# Patient Record
Sex: Female | Born: 1970 | Race: White | Hispanic: No | Marital: Single
Health system: Southern US, Community
[De-identification: ages and names within clinical notes are randomized; demographics above are authoritative.]

## PROBLEM LIST (undated history)

## (undated) DIAGNOSIS — E119 Type 2 diabetes mellitus without complications: Secondary | ICD-10-CM

---

## 2014-01-16 DIAGNOSIS — E782 Mixed hyperlipidemia: Secondary | ICD-10-CM | POA: Insufficient documentation

## 2014-01-16 DIAGNOSIS — F419 Anxiety disorder, unspecified: Secondary | ICD-10-CM | POA: Insufficient documentation

## 2014-01-16 DIAGNOSIS — E1142 Type 2 diabetes mellitus with diabetic polyneuropathy: Secondary | ICD-10-CM | POA: Insufficient documentation

## 2014-01-16 DIAGNOSIS — I10 Essential (primary) hypertension: Secondary | ICD-10-CM | POA: Insufficient documentation

## 2014-06-15 ENCOUNTER — Ambulatory Visit: Payer: Self-pay | Admitting: Family Medicine

## 2015-05-03 ENCOUNTER — Other Ambulatory Visit: Payer: Self-pay | Admitting: Family Medicine

## 2015-05-03 DIAGNOSIS — Z23 Encounter for immunization: Secondary | ICD-10-CM

## 2015-06-20 ENCOUNTER — Ambulatory Visit
Admission: RE | Admit: 2015-06-20 | Discharge: 2015-06-20 | Disposition: A | Discharge status: Home / Self Care | Payer: BC Managed Care – PPO | Source: Ambulatory Visit | Attending: Family Medicine | Admitting: Family Medicine

## 2015-06-20 DIAGNOSIS — Z1231 Encounter for screening mammogram for malignant neoplasm of breast: Secondary | ICD-10-CM | POA: Diagnosis present

## 2015-06-20 DIAGNOSIS — Z23 Encounter for immunization: Secondary | ICD-10-CM

## 2016-05-30 ENCOUNTER — Other Ambulatory Visit: Payer: Self-pay | Admitting: Family Medicine

## 2016-05-30 DIAGNOSIS — Z1231 Encounter for screening mammogram for malignant neoplasm of breast: Secondary | ICD-10-CM

## 2016-06-22 ENCOUNTER — Ambulatory Visit
Admission: RE | Admit: 2016-06-22 | Discharge: 2016-06-22 | Disposition: A | Discharge status: Home / Self Care | Payer: BC Managed Care – PPO | Source: Ambulatory Visit | Attending: Family Medicine | Admitting: Family Medicine

## 2016-06-22 DIAGNOSIS — Z1231 Encounter for screening mammogram for malignant neoplasm of breast: Secondary | ICD-10-CM | POA: Insufficient documentation

## 2016-12-07 DIAGNOSIS — M310 Hypersensitivity angiitis: Secondary | ICD-10-CM | POA: Insufficient documentation

## 2017-02-26 DIAGNOSIS — D251 Intramural leiomyoma of uterus: Secondary | ICD-10-CM | POA: Insufficient documentation

## 2017-07-04 ENCOUNTER — Other Ambulatory Visit: Payer: Self-pay | Admitting: Family Medicine

## 2017-07-04 DIAGNOSIS — Z1231 Encounter for screening mammogram for malignant neoplasm of breast: Secondary | ICD-10-CM

## 2017-07-19 ENCOUNTER — Ambulatory Visit
Admission: RE | Admit: 2017-07-19 | Discharge: 2017-07-19 | Disposition: A | Discharge status: Home / Self Care | Payer: BC Managed Care – PPO | Source: Ambulatory Visit | Attending: Family Medicine | Admitting: Family Medicine

## 2017-07-19 DIAGNOSIS — Z1231 Encounter for screening mammogram for malignant neoplasm of breast: Secondary | ICD-10-CM | POA: Insufficient documentation

## 2019-06-19 DIAGNOSIS — J302 Other seasonal allergic rhinitis: Secondary | ICD-10-CM | POA: Insufficient documentation

## 2019-06-19 DIAGNOSIS — E559 Vitamin D deficiency, unspecified: Secondary | ICD-10-CM | POA: Insufficient documentation

## 2019-06-19 DIAGNOSIS — D649 Anemia, unspecified: Secondary | ICD-10-CM | POA: Insufficient documentation

## 2019-06-19 DIAGNOSIS — D500 Iron deficiency anemia secondary to blood loss (chronic): Secondary | ICD-10-CM | POA: Insufficient documentation

## 2019-06-19 DIAGNOSIS — E039 Hypothyroidism, unspecified: Secondary | ICD-10-CM | POA: Insufficient documentation

## 2019-09-09 ENCOUNTER — Other Ambulatory Visit: Payer: Self-pay | Admitting: Family Medicine

## 2019-09-09 DIAGNOSIS — Z1231 Encounter for screening mammogram for malignant neoplasm of breast: Secondary | ICD-10-CM

## 2019-11-13 ENCOUNTER — Ambulatory Visit
Admission: RE | Admit: 2019-11-13 | Discharge: 2019-11-13 | Disposition: A | Discharge status: Home / Self Care | Payer: BC Managed Care – PPO | Source: Ambulatory Visit | Attending: Family Medicine | Admitting: Family Medicine

## 2019-11-13 DIAGNOSIS — Z1231 Encounter for screening mammogram for malignant neoplasm of breast: Secondary | ICD-10-CM | POA: Diagnosis present

## 2021-10-16 NOTE — Progress Notes (Signed)
Patient pre-screened for BCCCP eligibility due to COVID 19 precautions. Two patient identifiers used for verification that I was speaking to correct patient.  Patient to Present directly to [facility] 10/18/21 for BCCCP screening mammogram.  

## 2021-10-18 ENCOUNTER — Ambulatory Visit
Admission: RE | Admit: 2021-10-18 | Discharge: 2021-10-18 | Disposition: A | Discharge status: Home / Self Care | Payer: Self-pay | Source: Ambulatory Visit | Attending: Oncology | Admitting: Oncology

## 2021-10-18 ENCOUNTER — Other Ambulatory Visit: Payer: Self-pay

## 2021-10-18 ENCOUNTER — Ambulatory Visit: Payer: Self-pay | Attending: Oncology

## 2021-10-18 DIAGNOSIS — Z0000 Encounter for general adult medical examination without abnormal findings: Secondary | ICD-10-CM | POA: Insufficient documentation

## 2021-11-05 NOTE — Progress Notes (Signed)
Letter mailed from [facility] to notify of normal mammogram results.  Patient to return in one year for annual screening.  Copy to [facility]. 

## 2022-03-18 IMAGING — MG MM DIGITAL SCREENING BILAT W/ TOMO AND CAD
8 series · 9 of 24 positions shown · non-contrast
Comparison: Previous exam(s).

CLINICAL DATA: Screening.

EXAM:
DIGITAL SCREENING BILATERAL MAMMOGRAM WITH TOMOSYNTHESIS AND CAD
TECHNIQUE: Bilateral screening digital craniocaudal and mediolateral oblique
mammograms were obtained. Bilateral screening digital breast
tomosynthesis was performed. The images were evaluated with
computer-aided detection.

[R MLO synth-2D]
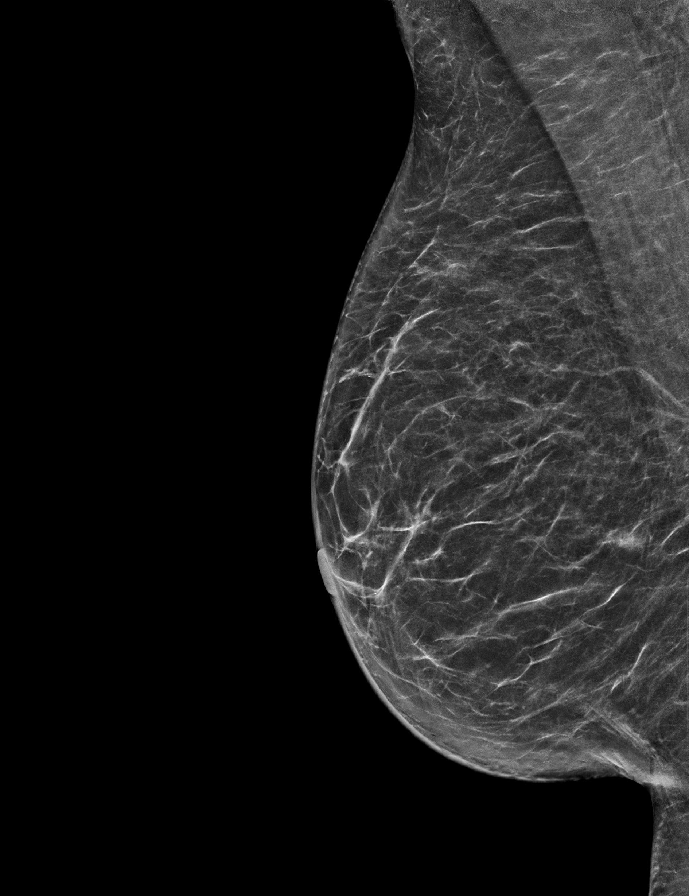

[L CC synth-2D]
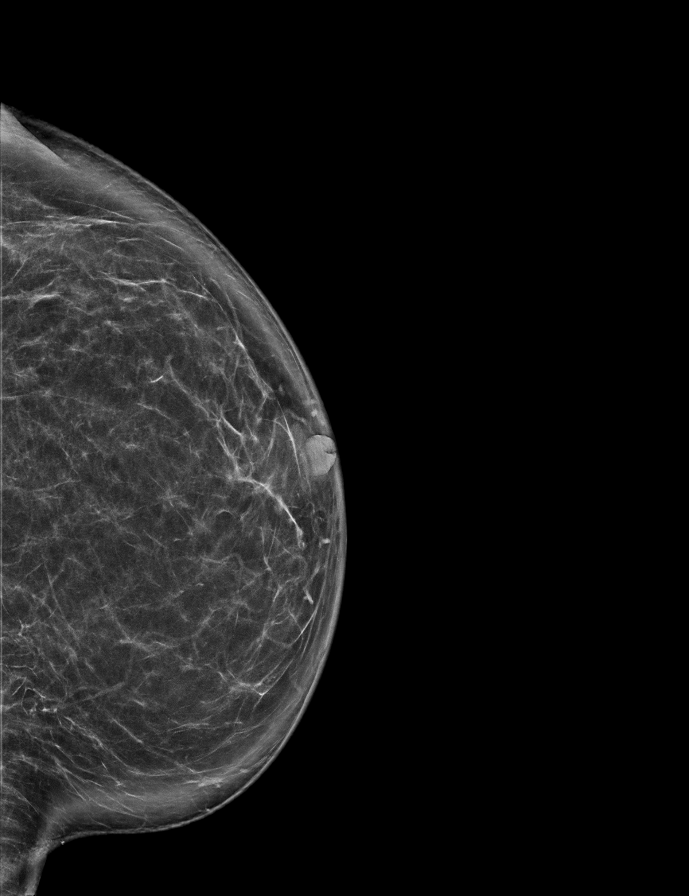

[R CC synth-2D]
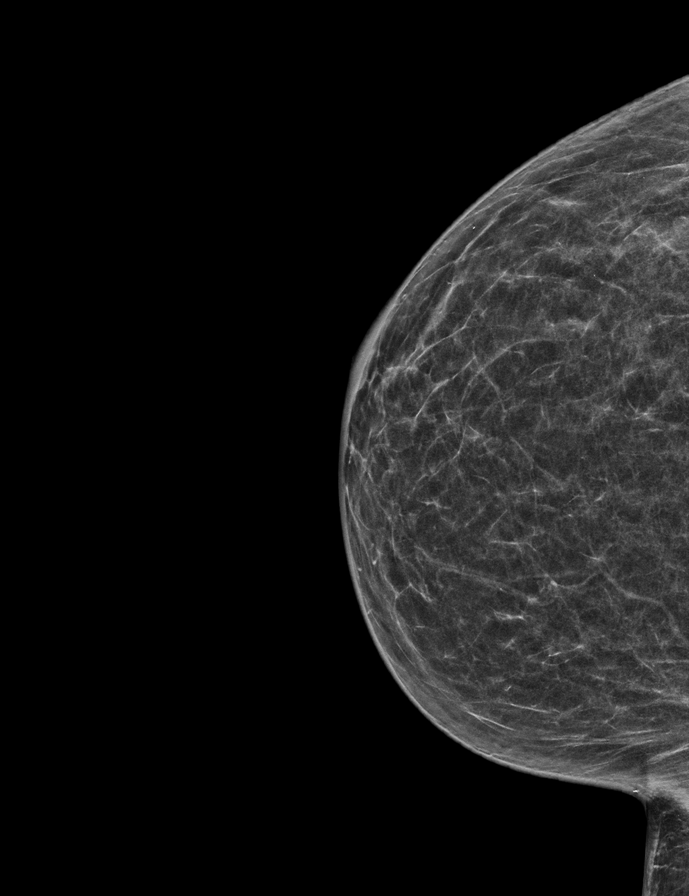

[L MLO synth-2D]
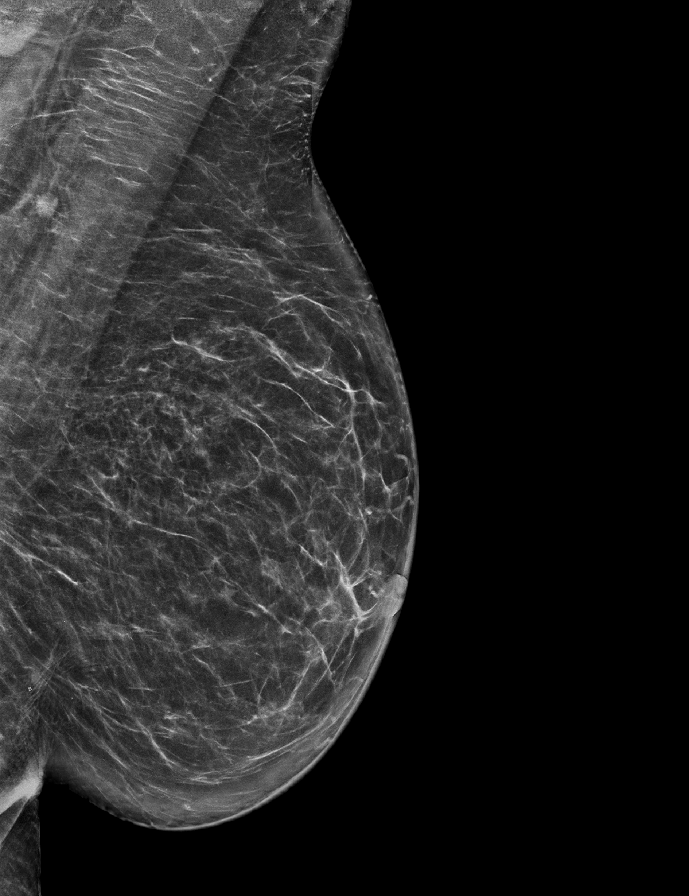

[R MLO tomo · 2 of 67 frames shown]
[frame 22/67]
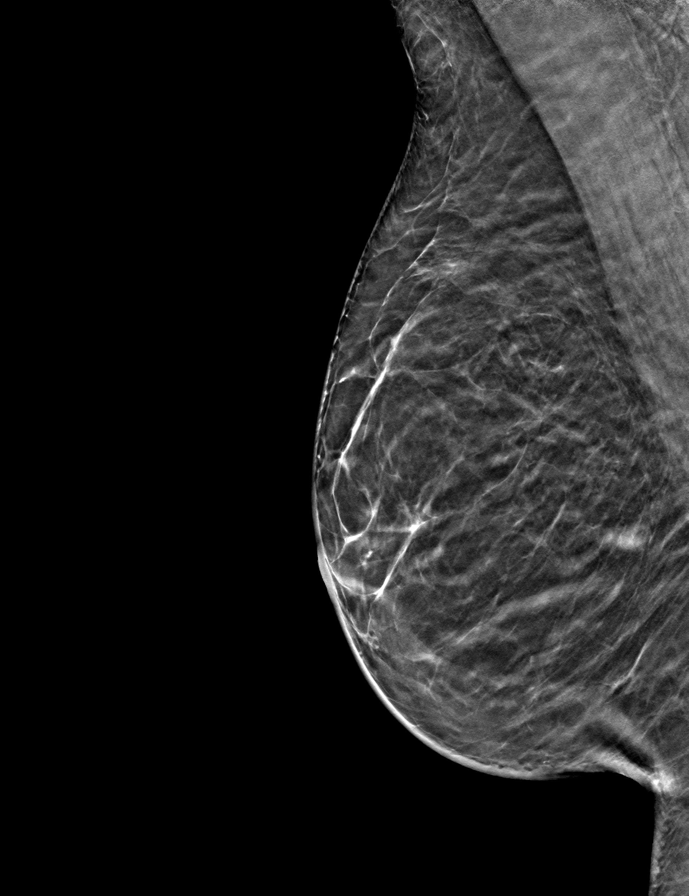
[frame 34/67]
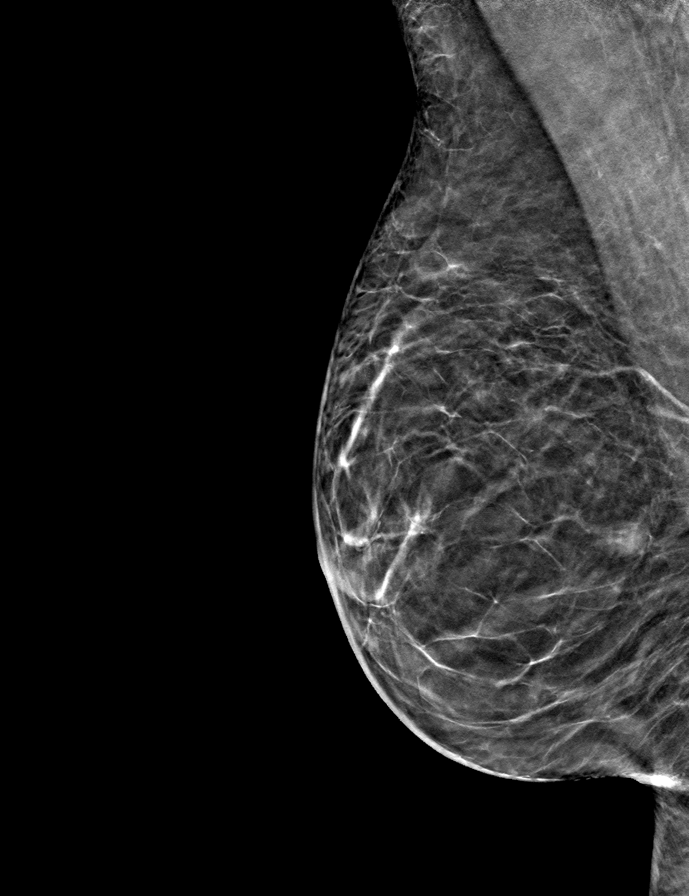

[R CC tomo · tomo slice 25/48.0]
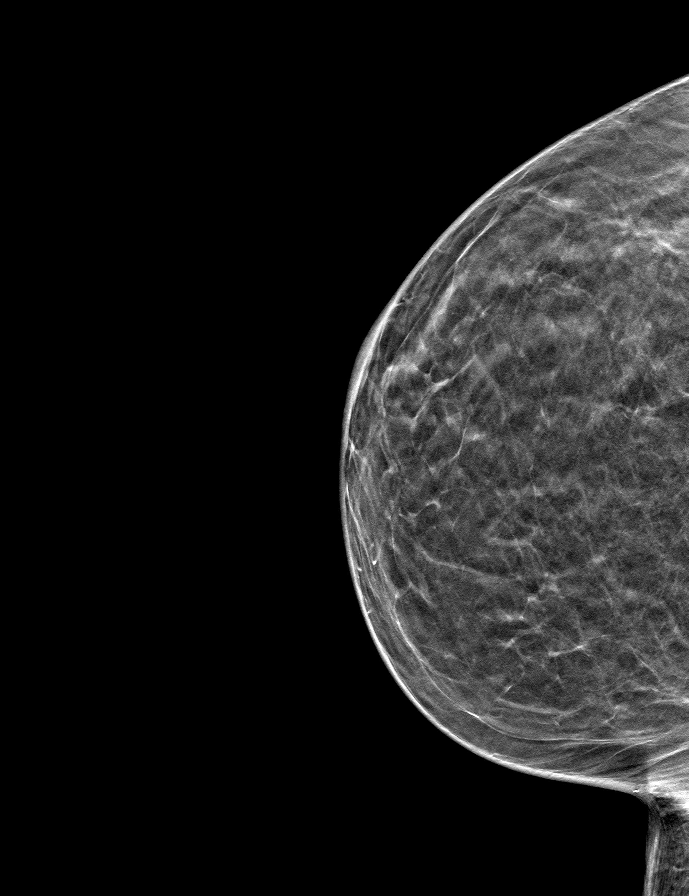

[L MLO tomo · tomo slice 37/74.0]
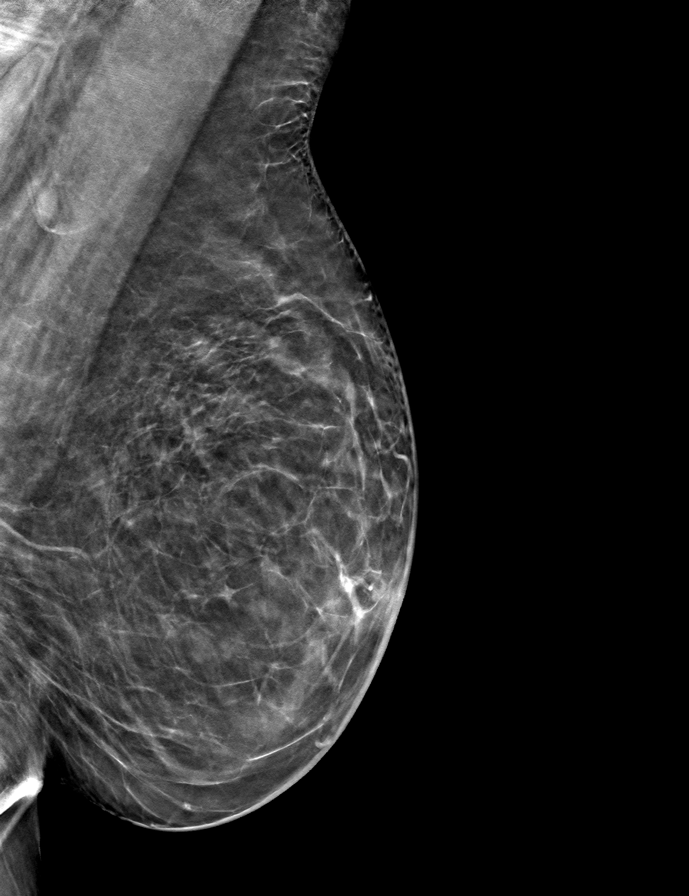

[L CC tomo · tomo slice 39/78.0]
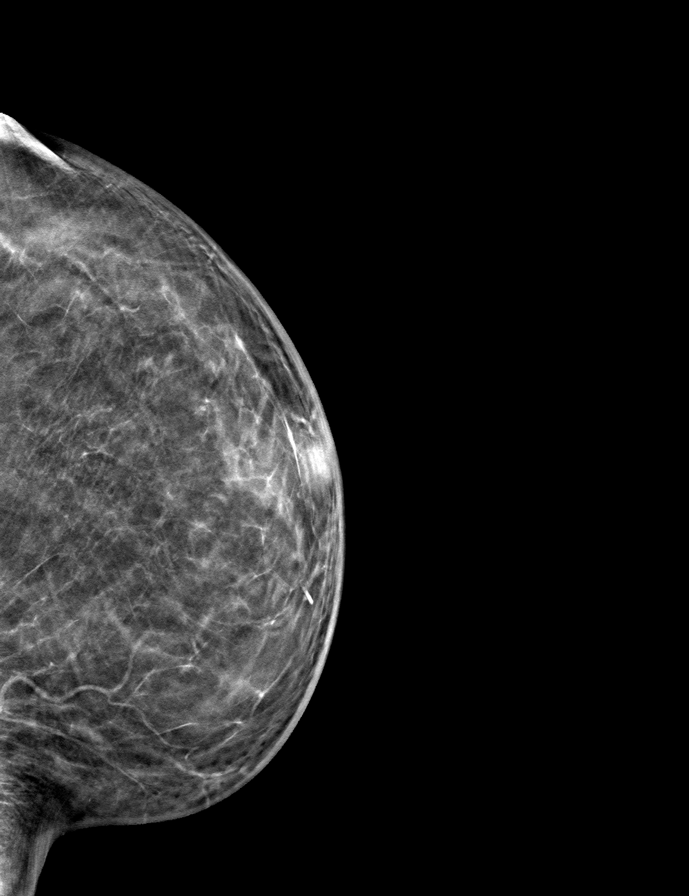

[9 of 24 positions shown; findings below may reference images not displayed]

ACR Breast Density Category b: There are scattered areas of
fibroglandular density.
FINDINGS: There are no findings suspicious for malignancy.
IMPRESSION: No mammographic evidence of malignancy. A result letter of this
screening mammogram will be mailed directly to the patient.

RECOMMENDATION:
Screening mammogram in one year. (Code:51-O-LD2)

BI-RADS CATEGORY  1: Negative.

## 2022-05-07 DIAGNOSIS — H25812 Combined forms of age-related cataract, left eye: Secondary | ICD-10-CM | POA: Insufficient documentation

## 2022-06-20 DIAGNOSIS — H2513 Age-related nuclear cataract, bilateral: Secondary | ICD-10-CM | POA: Insufficient documentation

## 2022-12-20 DIAGNOSIS — E113593 Type 2 diabetes mellitus with proliferative diabetic retinopathy without macular edema, bilateral: Secondary | ICD-10-CM | POA: Diagnosis not present

## 2023-01-18 DIAGNOSIS — Z712 Person consulting for explanation of examination or test findings: Secondary | ICD-10-CM | POA: Diagnosis not present

## 2023-01-18 DIAGNOSIS — F321 Major depressive disorder, single episode, moderate: Secondary | ICD-10-CM | POA: Diagnosis not present

## 2023-01-18 DIAGNOSIS — I10 Essential (primary) hypertension: Secondary | ICD-10-CM | POA: Diagnosis not present

## 2023-01-18 DIAGNOSIS — Z0130 Encounter for examination of blood pressure without abnormal findings: Secondary | ICD-10-CM | POA: Diagnosis not present

## 2023-01-18 DIAGNOSIS — Z1389 Encounter for screening for other disorder: Secondary | ICD-10-CM | POA: Diagnosis not present

## 2023-01-18 DIAGNOSIS — E119 Type 2 diabetes mellitus without complications: Secondary | ICD-10-CM | POA: Diagnosis not present

## 2023-01-18 DIAGNOSIS — E781 Pure hyperglyceridemia: Secondary | ICD-10-CM | POA: Diagnosis not present

## 2023-01-29 ENCOUNTER — Other Ambulatory Visit: Payer: Self-pay | Admitting: Family Medicine

## 2023-01-29 DIAGNOSIS — Z1231 Encounter for screening mammogram for malignant neoplasm of breast: Secondary | ICD-10-CM

## 2023-03-07 DIAGNOSIS — E113593 Type 2 diabetes mellitus with proliferative diabetic retinopathy without macular edema, bilateral: Secondary | ICD-10-CM | POA: Diagnosis not present

## 2023-04-08 DIAGNOSIS — Z0130 Encounter for examination of blood pressure without abnormal findings: Secondary | ICD-10-CM | POA: Diagnosis not present

## 2023-04-08 DIAGNOSIS — E119 Type 2 diabetes mellitus without complications: Secondary | ICD-10-CM | POA: Diagnosis not present

## 2023-04-08 DIAGNOSIS — Z712 Person consulting for explanation of examination or test findings: Secondary | ICD-10-CM | POA: Diagnosis not present

## 2023-04-08 DIAGNOSIS — Z1389 Encounter for screening for other disorder: Secondary | ICD-10-CM | POA: Diagnosis not present

## 2023-04-08 DIAGNOSIS — E781 Pure hyperglyceridemia: Secondary | ICD-10-CM | POA: Diagnosis not present

## 2023-04-08 DIAGNOSIS — I10 Essential (primary) hypertension: Secondary | ICD-10-CM | POA: Diagnosis not present

## 2023-04-08 DIAGNOSIS — F411 Generalized anxiety disorder: Secondary | ICD-10-CM | POA: Diagnosis not present

## 2023-05-02 ENCOUNTER — Ambulatory Visit
Admission: RE | Admit: 2023-05-02 | Discharge: 2023-05-02 | Disposition: A | Discharge status: Home / Self Care | Payer: Medicaid Other | Source: Ambulatory Visit | Attending: Family Medicine | Admitting: Family Medicine

## 2023-05-02 DIAGNOSIS — Z1231 Encounter for screening mammogram for malignant neoplasm of breast: Secondary | ICD-10-CM | POA: Insufficient documentation

## 2023-05-03 DIAGNOSIS — Z1159 Encounter for screening for other viral diseases: Secondary | ICD-10-CM | POA: Diagnosis not present

## 2023-05-03 DIAGNOSIS — Z124 Encounter for screening for malignant neoplasm of cervix: Secondary | ICD-10-CM | POA: Diagnosis not present

## 2023-05-03 DIAGNOSIS — Z01419 Encounter for gynecological examination (general) (routine) without abnormal findings: Secondary | ICD-10-CM | POA: Diagnosis not present

## 2023-05-03 DIAGNOSIS — E781 Pure hyperglyceridemia: Secondary | ICD-10-CM | POA: Diagnosis not present

## 2023-05-03 DIAGNOSIS — I10 Essential (primary) hypertension: Secondary | ICD-10-CM | POA: Diagnosis not present

## 2023-05-03 DIAGNOSIS — Z0131 Encounter for examination of blood pressure with abnormal findings: Secondary | ICD-10-CM | POA: Diagnosis not present

## 2023-05-03 DIAGNOSIS — E119 Type 2 diabetes mellitus without complications: Secondary | ICD-10-CM | POA: Diagnosis not present

## 2023-05-03 DIAGNOSIS — Z1331 Encounter for screening for depression: Secondary | ICD-10-CM | POA: Diagnosis not present

## 2023-05-03 DIAGNOSIS — Z712 Person consulting for explanation of examination or test findings: Secondary | ICD-10-CM | POA: Diagnosis not present

## 2023-05-03 DIAGNOSIS — Z1389 Encounter for screening for other disorder: Secondary | ICD-10-CM | POA: Diagnosis not present

## 2023-05-24 DIAGNOSIS — E113511 Type 2 diabetes mellitus with proliferative diabetic retinopathy with macular edema, right eye: Secondary | ICD-10-CM | POA: Diagnosis not present

## 2023-07-05 DIAGNOSIS — H26493 Other secondary cataract, bilateral: Secondary | ICD-10-CM | POA: Diagnosis not present

## 2023-07-05 DIAGNOSIS — E113513 Type 2 diabetes mellitus with proliferative diabetic retinopathy with macular edema, bilateral: Secondary | ICD-10-CM | POA: Diagnosis not present

## 2023-08-02 DIAGNOSIS — E113511 Type 2 diabetes mellitus with proliferative diabetic retinopathy with macular edema, right eye: Secondary | ICD-10-CM | POA: Diagnosis not present

## 2023-09-06 DIAGNOSIS — E113513 Type 2 diabetes mellitus with proliferative diabetic retinopathy with macular edema, bilateral: Secondary | ICD-10-CM | POA: Diagnosis not present

## 2023-09-06 DIAGNOSIS — Z961 Presence of intraocular lens: Secondary | ICD-10-CM | POA: Diagnosis not present

## 2023-09-06 DIAGNOSIS — H26493 Other secondary cataract, bilateral: Secondary | ICD-10-CM | POA: Diagnosis not present

## 2023-10-10 DIAGNOSIS — E113513 Type 2 diabetes mellitus with proliferative diabetic retinopathy with macular edema, bilateral: Secondary | ICD-10-CM | POA: Diagnosis not present

## 2023-10-10 DIAGNOSIS — H26493 Other secondary cataract, bilateral: Secondary | ICD-10-CM | POA: Diagnosis not present

## 2023-12-05 DIAGNOSIS — E113511 Type 2 diabetes mellitus with proliferative diabetic retinopathy with macular edema, right eye: Secondary | ICD-10-CM | POA: Diagnosis not present

## 2023-12-05 DIAGNOSIS — E113513 Type 2 diabetes mellitus with proliferative diabetic retinopathy with macular edema, bilateral: Secondary | ICD-10-CM | POA: Diagnosis not present

## 2023-12-31 DIAGNOSIS — Z1331 Encounter for screening for depression: Secondary | ICD-10-CM | POA: Diagnosis not present

## 2023-12-31 DIAGNOSIS — I10 Essential (primary) hypertension: Secondary | ICD-10-CM | POA: Diagnosis not present

## 2023-12-31 DIAGNOSIS — Z0130 Encounter for examination of blood pressure without abnormal findings: Secondary | ICD-10-CM | POA: Diagnosis not present

## 2023-12-31 DIAGNOSIS — E781 Pure hyperglyceridemia: Secondary | ICD-10-CM | POA: Diagnosis not present

## 2023-12-31 DIAGNOSIS — F321 Major depressive disorder, single episode, moderate: Secondary | ICD-10-CM | POA: Diagnosis not present

## 2023-12-31 DIAGNOSIS — Z1389 Encounter for screening for other disorder: Secondary | ICD-10-CM | POA: Diagnosis not present

## 2023-12-31 DIAGNOSIS — E119 Type 2 diabetes mellitus without complications: Secondary | ICD-10-CM | POA: Diagnosis not present

## 2023-12-31 DIAGNOSIS — Z712 Person consulting for explanation of examination or test findings: Secondary | ICD-10-CM | POA: Diagnosis not present

## 2024-01-23 ENCOUNTER — Encounter: Payer: Self-pay | Admitting: Family Medicine

## 2024-01-27 ENCOUNTER — Other Ambulatory Visit: Payer: Self-pay

## 2024-01-27 ENCOUNTER — Telehealth: Payer: Self-pay

## 2024-01-27 DIAGNOSIS — Z1211 Encounter for screening for malignant neoplasm of colon: Secondary | ICD-10-CM

## 2024-01-27 MED ORDER — NA SULFATE-K SULFATE-MG SULF 17.5-3.13-1.6 GM/177ML PO SOLN: 1.0000 | Freq: Once | ORAL | 0 refills | Status: AC

## 2024-01-27 NOTE — Telephone Encounter (Signed)
 The patient called in to schedule her colonoscopy.

## 2024-01-27 NOTE — Telephone Encounter (Signed)
Gastroenterology Pre-Procedure Review  Request Date: 02/19/24 Requesting Physician: Dr. Allegra Lai  PATIENT REVIEW QUESTIONS: The patient responded to the following health history questions as indicated:    1. Are you having any GI issues? no 2. Do you have a personal history of Polyps? no 3. Do you have a family history of Colon Cancer or Polyps? 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 Diabetes Mellitus? yes (takes Metformin has been advised to stop 2 days prior to colonoscopy and noted on instructions.  Has been advised and noted to take half of the usual amount of levemir the day before procedure.) 5. Joint replacements in the past 12 months?no 6. Major health problems in the past 3 months?no 7. Any artificial heart valves, MVP, or defibrillator?no    MEDICATIONS & ALLERGIES:    Patient reports the following regarding taking any anticoagulation/antiplatelet therapy:   Plavix, Coumadin, Eliquis, Xarelto, Lovenox, Pradaxa, Brilinta, or Effient? no Aspirin? yes (81 mg daily)  Patient confirms/reports the following medications:  Current Outpatient Medications  Medication Sig Dispense Refill   aspirin EC 81 MG tablet Take by mouth.     BD ULTRA-FINE PEN NEEDLES 29G X 12.7MM MISC See admin instructions.     Continuous Glucose Receiver (DEXCOM G5 MOBILE RECEIVER) DEVI See admin instructions.     Continuous Glucose Sensor (DEXCOM G5 MOB/G4 PLAT SENSOR) MISC See admin instructions.     glucose blood (ONETOUCH VERIO) test strip USE FOUR TIMES DAILY AS INSTRUCTED     glucose blood (ONETOUCH VERIO) test strip USE FOUR TIMES DAILY AS INSTRUCTED     lisinopril (ZESTRIL) 20 MG tablet Take by mouth.     PARoxetine (PAXIL) 20 MG tablet Take 20 mg by mouth daily.     amLODipine (NORVASC) 10 MG tablet Take 10 mg by mouth daily.     ascorbic acid (VITAMIN C) 1000 MG tablet Take by mouth.     buPROPion (WELLBUTRIN XL) 300 MG 24 hr tablet Take 300 mg by mouth daily.     cyanocobalamin (VITAMIN B12) 1000 MCG tablet  Take by mouth.     L-Lysine HCl 500 MG TABS Take by mouth.     LEVEMIR FLEXPEN 100 UNIT/ML FlexPen Inject into the skin.     MAGNESIUM PO Take by mouth.     metFORMIN (GLUCOPHAGE) 1000 MG tablet Take 1,000 mg by mouth 2 (two) times daily.     metoprolol succinate (TOPROL-XL) 100 MG 24 hr tablet Take 100 mg by mouth daily.     rosuvastatin (CRESTOR) 40 MG tablet Take 40 mg by mouth daily.     Vitamin E 670 MG (1000 UT) CAPS Take by mouth.     No current facility-administered medications for this visit.    Patient confirms/reports the following allergies:  Allergies  Allergen Reactions   Hydrochlorothiazide Swelling   Fluticasone Propionate Other (See Comments)    Nose burning  Other reaction(s): Other (See Comments)  Nose burning    No orders of the defined types were placed in this encounter.   AUTHORIZATION INFORMATION Primary Insurance: 1D#: Group #:  Secondary Insurance: 1D#: Group #:  SCHEDULE INFORMATION: Date: 02/19/24 Time: Location: [facility]

## 2024-01-30 DIAGNOSIS — Z961 Presence of intraocular lens: Secondary | ICD-10-CM | POA: Diagnosis not present

## 2024-01-30 DIAGNOSIS — H26493 Other secondary cataract, bilateral: Secondary | ICD-10-CM | POA: Diagnosis not present

## 2024-01-30 DIAGNOSIS — E113511 Type 2 diabetes mellitus with proliferative diabetic retinopathy with macular edema, right eye: Secondary | ICD-10-CM | POA: Diagnosis not present

## 2024-02-18 ENCOUNTER — Encounter: Payer: Self-pay | Admitting: Gastroenterology

## 2024-02-19 ENCOUNTER — Ambulatory Visit: Payer: Medicaid Other | Admitting: Registered Nurse

## 2024-02-19 ENCOUNTER — Encounter: Payer: Self-pay | Admitting: Gastroenterology

## 2024-02-19 ENCOUNTER — Encounter
Admission: RE | Disposition: A | Discharge status: Home / Self Care | Payer: Self-pay | Source: Home / Self Care | Attending: Gastroenterology

## 2024-02-19 ENCOUNTER — Ambulatory Visit
Admission: RE | Admit: 2024-02-19 | Discharge: 2024-02-19 | Disposition: A | Discharge status: Home / Self Care | Payer: Medicaid Other | Attending: Gastroenterology | Admitting: Gastroenterology

## 2024-02-19 DIAGNOSIS — E039 Hypothyroidism, unspecified: Secondary | ICD-10-CM | POA: Diagnosis not present

## 2024-02-19 DIAGNOSIS — Z1211 Encounter for screening for malignant neoplasm of colon: Secondary | ICD-10-CM | POA: Diagnosis not present

## 2024-02-19 DIAGNOSIS — Z7984 Long term (current) use of oral hypoglycemic drugs: Secondary | ICD-10-CM | POA: Diagnosis not present

## 2024-02-19 DIAGNOSIS — Z794 Long term (current) use of insulin: Secondary | ICD-10-CM | POA: Diagnosis not present

## 2024-02-19 DIAGNOSIS — I10 Essential (primary) hypertension: Secondary | ICD-10-CM | POA: Diagnosis not present

## 2024-02-19 DIAGNOSIS — E119 Type 2 diabetes mellitus without complications: Secondary | ICD-10-CM | POA: Insufficient documentation

## 2024-02-19 HISTORY — PX: COLONOSCOPY WITH PROPOFOL: SHX5780

## 2024-02-19 HISTORY — DX: Type 2 diabetes mellitus without complications: E11.9

## 2024-02-19 LAB — GLUCOSE, CAPILLARY: Glucose-Capillary: 145 mg/dL — ABNORMAL HIGH (ref 70–99)

## 2024-02-19 SURGERY — COLONOSCOPY WITH PROPOFOL
Anesthesia: General

## 2024-02-19 MED ORDER — LIDOCAINE HCL (CARDIAC) PF 100 MG/5ML IV SOSY
PREFILLED_SYRINGE | INTRAVENOUS | Status: DC | PRN
  Administered 2024-02-19: 4 mg via INTRAVENOUS

## 2024-02-19 MED ORDER — SODIUM CHLORIDE 0.9 % IV SOLN
INTRAVENOUS | Status: DC
  Administered 2024-02-19: 20 mL/h via INTRAVENOUS

## 2024-02-19 MED ORDER — PROPOFOL 500 MG/50ML IV EMUL
INTRAVENOUS | Status: DC | PRN
  Administered 2024-02-19: 150 ug/kg/min via INTRAVENOUS

## 2024-02-19 MED ORDER — PHENYLEPHRINE 80 MCG/ML (10ML) SYRINGE FOR IV PUSH (FOR BLOOD PRESSURE SUPPORT)
PREFILLED_SYRINGE | INTRAVENOUS | Status: DC | PRN
  Administered 2024-02-19: 160 ug via INTRAVENOUS

## 2024-02-19 MED ORDER — SODIUM CHLORIDE 0.9% FLUSH: 3.0000 mL | INTRAVENOUS | Status: DC | PRN

## 2024-02-19 MED ORDER — PROPOFOL 10 MG/ML IV BOLUS
INTRAVENOUS | Status: DC | PRN
  Administered 2024-02-19: 80 mg via INTRAVENOUS

## 2024-02-19 MED ORDER — DEXMEDETOMIDINE HCL IN NACL 80 MCG/20ML IV SOLN
INTRAVENOUS | Status: DC | PRN
Start: 2024-02-19 — End: 2024-02-19
  Administered 2024-02-19: 8 ug via INTRAVENOUS

## 2024-02-19 MED ORDER — SODIUM CHLORIDE 0.9% FLUSH
3.0000 mL | Freq: Two times a day (BID) | INTRAVENOUS | Status: DC
Start: 2024-02-19 — End: 2024-02-19

## 2024-02-19 NOTE — Anesthesia Preprocedure Evaluation (Addendum)
 Anesthesia Evaluation  Patient identified by MRN, date of birth, ID band Patient awake    Reviewed: Allergy & Precautions, NPO status , Patient's Chart, lab work & pertinent test results  History of Anesthesia Complications Negative for: history of anesthetic complications  Airway Mallampati: III  TM Distance: <3 FB Neck ROM: full    Dental  (+) Chipped   Pulmonary neg pulmonary ROS, neg shortness of breath   Pulmonary exam normal        Cardiovascular hypertension, (-) angina Normal cardiovascular exam     Neuro/Psych  Neuromuscular disease  negative psych ROS   GI/Hepatic negative GI ROS, Neg liver ROS,neg GERD  ,,  Endo/Other  diabetes, Type 2Hypothyroidism    Renal/GU negative Renal ROS  negative genitourinary   Musculoskeletal   Abdominal   Peds  Hematology negative hematology ROS (+)   Anesthesia Other Findings Past Medical History: No date: Diabetes mellitus without complication (HCC)  History reviewed. No pertinent surgical history.  BMI    Body Mass Index: 25.65 kg/m      Reproductive/Obstetrics negative OB ROS                             Anesthesia Physical Anesthesia Plan  ASA: 3  Anesthesia Plan: General   Post-op Pain Management:    Induction: Intravenous  PONV Risk Score and Plan: Propofol infusion and TIVA  Airway Management Planned: Natural Airway and Nasal Cannula  Additional Equipment:   Intra-op Plan:   Post-operative Plan:   Informed Consent: I have reviewed the patients History and Physical, chart, labs and discussed the procedure including the risks, benefits and alternatives for the proposed anesthesia with the patient or authorized representative who has indicated his/her understanding and acceptance.     Dental Advisory Given  Plan Discussed with: Anesthesiologist, CRNA and Surgeon  Anesthesia Plan Comments: (Patient consented for risks  of anesthesia including but not limited to:  - adverse reactions to medications - risk of airway placement if required - damage to eyes, teeth, lips or other oral mucosa - nerve damage due to positioning  - sore throat or hoarseness - Damage to heart, brain, nerves, lungs, other parts of body or loss of life  Patient voiced understanding and assent.)       Anesthesia Quick Evaluation

## 2024-02-19 NOTE — Anesthesia Procedure Notes (Signed)
 Date/Time: 02/19/2024 8:44 AM  Performed by: Stormy Fabian, CRNAPre-anesthesia Checklist: Patient identified, Emergency Drugs available, Suction available and Patient being monitored Patient Re-evaluated:Patient Re-evaluated prior to induction Oxygen Delivery Method: Nasal cannula Induction Type: IV induction Dental Injury: Teeth and Oropharynx as per pre-operative assessment  Comments: Nasal cannula with etCO2 monitoring

## 2024-02-19 NOTE — Op Note (Signed)
 [facility] Gastroenterology Patient Name: Mackenzie Duarte Procedure Date: 02/19/2024 8:35 AM MRN: [id number] Account #: [id number] Date of Birth: Jun 28, 1971 Admit Type: Outpatient Age: 53 Room: [facility] ENDO ROOM 4 Gender: Female Note Status: Finalized Instrument Name: Peds Colonoscope [id number] Procedure:             Colonoscopy Indications:           Screening for colorectal malignant neoplasm, This is                         the patient's first colonoscopy Providers:             Toney Reil MD, MD Referring MD:          Shane Crutch PA Medicines:             General Anesthesia Complications:         No immediate complications. Estimated blood loss: None. Procedure:             Pre-Anesthesia Assessment:                        - Prior to the procedure, a History and Physical was                         performed, and patient medications and allergies were                         reviewed. The patient is competent. The risks and                         benefits of the procedure and the sedation options and                         risks were discussed with the patient. All questions                         were answered and informed consent was obtained.                         Patient identification and proposed procedure were                         verified by the physician, the nurse, the                         anesthesiologist, the anesthetist and the technician                         in the pre-procedure area in the procedure room in the                         endoscopy suite. Mental Status Examination: alert and                         oriented. Airway Examination: normal oropharyngeal                         airway and neck mobility. Respiratory Examination:  clear to auscultation. CV Examination: normal.                         Prophylactic Antibiotics: The patient does not require                         prophylactic  antibiotics. Prior Anticoagulants: The                         patient has taken no anticoagulant or antiplatelet                         agents. ASA Grade Assessment: III - A patient with                         severe systemic disease. After reviewing the risks and                         benefits, the patient was deemed in satisfactory                         condition to undergo the procedure. The anesthesia                         plan was to use general anesthesia. Immediately prior                         to administration of medications, the patient was                         re-assessed for adequacy to receive sedatives. The                         heart rate, respiratory rate, oxygen saturations,                         blood pressure, adequacy of pulmonary ventilation, and                         response to care were monitored throughout the                         procedure. The physical status of the patient was                         re-assessed after the procedure.                        After obtaining informed consent, the colonoscope was                         passed under direct vision. Throughout the procedure,                         the patient's blood pressure, pulse, and oxygen                         saturations were monitored continuously. The  Colonoscope was introduced through the anus and                         advanced to the the cecum, identified by appendiceal                         orifice and ileocecal valve. The colonoscopy was                         performed without difficulty. The patient tolerated                         the procedure well. The quality of the bowel                         preparation was evaluated using the BBPS [facility] Bowel                         Preparation Scale) with scores of: Right Colon = 3,                         Transverse Colon = 3 and Left Colon = 3 (entire mucosa                         seen  well with no residual staining, small fragments                         of stool or opaque liquid). The total BBPS score                         equals 9. The ileocecal valve, appendiceal orifice,                         and rectum were photographed. Findings:      The perianal and digital rectal examinations were normal. Pertinent       negatives include normal sphincter tone and no palpable rectal lesions.      The entire examined colon appeared normal.      The retroflexed view of the distal rectum and anal verge was normal and       showed no anal or rectal abnormalities. Impression:            - The entire examined colon is normal.                        - The distal rectum and anal verge are normal on                         retroflexion view.                        - No specimens collected. Recommendation:        - Discharge patient to home (with escort).                        - Resume previous diet today.                        -  Continue present medications.                        - Repeat colonoscopy in 10 years for screening                         purposes. Procedure Code(s):     --- Professional ---                        E4540, Colorectal cancer screening; colonoscopy on                         individual not meeting criteria for high risk Diagnosis Code(s):     --- Professional ---                        Z12.11, Encounter for screening for malignant neoplasm                         of colon CPT copyright 2022 American Medical Association. All rights reserved. The codes documented in this report are preliminary and upon coder review may  be revised to meet current compliance requirements. Dr. Libby Maw Toney Reil MD, MD 02/19/2024 8:55:33 AM This report has been signed electronically. Number of Addenda: 0 Note Initiated On: 02/19/2024 8:35 AM Scope Withdrawal Time: 0 hours 5 minutes 1 second  Total Procedure Duration: 0 hours 9 minutes 48 seconds  Estimated  Blood Loss:  Estimated blood loss: none.      [facility]

## 2024-02-19 NOTE — Anesthesia Postprocedure Evaluation (Signed)
 Anesthesia Post Note  Patient: Mackenzie Duarte  Procedure(s) Performed: COLONOSCOPY WITH PROPOFOL  Patient location during evaluation: Endoscopy Anesthesia Type: General Level of consciousness: awake and alert Pain management: pain level controlled Vital Signs Assessment: post-procedure vital signs reviewed and stable Respiratory status: spontaneous breathing, nonlabored ventilation, respiratory function stable and patient connected to nasal cannula oxygen Cardiovascular status: blood pressure returned to baseline and stable Postop Assessment: no apparent nausea or vomiting Anesthetic complications: no   No notable events documented.   Last Vitals:  Vitals:   02/19/24 0902 02/19/24 0911  BP: 92/62 108/67  Pulse: 78 82  Resp: 18 14  Temp: (!) 35.6 C   SpO2: 99% 99%    Last Pain:  Vitals:   02/19/24 0902  TempSrc: Temporal  PainSc: Asleep                 Cleda Mccreedy Jovanie Verge

## 2024-02-19 NOTE — Transfer of Care (Signed)
 Immediate Anesthesia Transfer of Care Note  Patient: Mackenzie Duarte  Procedure(s) Performed: Procedure(s): COLONOSCOPY WITH PROPOFOL (N/A)  Patient Location: PACU and Endoscopy Unit  Anesthesia Type:General  Level of Consciousness: sedated  Airway & Oxygen Therapy: Patient Spontanous Breathing and Patient connected to nasal cannula oxygen  Post-op Assessment: Report given to RN and Post -op Vital signs reviewed and stable  Post vital signs: Reviewed and stable  Last Vitals:  Vitals:   02/19/24 0902 02/19/24 0911  BP: 92/62 108/67  Pulse: 78 82  Resp: 18 14  Temp: (!) 35.6 C   SpO2: 99% 99%    Complications: No apparent anesthesia complications

## 2024-02-19 NOTE — H&P (Signed)
 Arlyss Repress, MD [address]  [address]  [place], [place] [address]  Main: [phone]  Fax: [phone] Pager: [phone]  Primary Care Physician:  Shane Crutch, [place] Primary Gastroenterologist:  Dr. Arlyss Repress  Pre-Procedure History & Physical: HPI:  Mackenzie Duarte is a 53 y.o. female is here for an colonoscopy.   Past Medical History:  Diagnosis Date   Diabetes mellitus without complication (HCC)     History reviewed. No pertinent surgical history.  Prior to Admission medications   Medication Sig Start Date End Date Taking? Authorizing Provider  amLODipine (NORVASC) 10 MG tablet Take 10 mg by mouth daily.   Yes [provider]  ascorbic acid (VITAMIN C) 1000 MG tablet Take by mouth.   Yes [provider]  aspirin EC 81 MG tablet Take by mouth. 06/17/12  Yes [provider]  BD ULTRA-FINE PEN NEEDLES 29G X 12.7MM MISC See admin instructions. 09/23/18  Yes [provider]  buPROPion (WELLBUTRIN XL) 300 MG 24 hr tablet Take 300 mg by mouth daily.   Yes [provider]  Continuous Glucose Receiver (DEXCOM G5 MOBILE RECEIVER) DEVI See admin instructions. 12/23/19  Yes [provider]  Continuous Glucose Sensor (DEXCOM G5 MOB/G4 PLAT SENSOR) MISC See admin instructions. 12/23/19  Yes [provider]  cyanocobalamin (VITAMIN B12) 1000 MCG tablet Take by mouth.   Yes [provider]  glucose blood (ONETOUCH VERIO) test strip USE FOUR TIMES DAILY AS INSTRUCTED 11/17/17  Yes [provider]  glucose blood (ONETOUCH VERIO) test strip USE FOUR TIMES DAILY AS INSTRUCTED 11/17/17  Yes [provider]  L-Lysine HCl 500 MG TABS Take by mouth.   Yes [provider]  LEVEMIR FLEXPEN 100 UNIT/ML FlexPen Inject into the skin.   Yes [provider]  lisinopril (ZESTRIL) 20 MG tablet Take by mouth. 02/10/16  Yes [provider]  MAGNESIUM PO Take by mouth.   Yes  [provider]  metFORMIN (GLUCOPHAGE) 1000 MG tablet Take 1,000 mg by mouth 2 (two) times daily.   Yes [provider]  metoprolol succinate (TOPROL-XL) 100 MG 24 hr tablet Take 100 mg by mouth daily.   Yes [provider]  PARoxetine (PAXIL) 20 MG tablet Take 20 mg by mouth daily. 01/01/24  Yes [provider]  rosuvastatin (CRESTOR) 40 MG tablet Take 40 mg by mouth daily.   Yes [provider]  Vitamin E 670 MG (1000 UT) CAPS Take by mouth.   Yes [provider]    Allergies as of 01/27/2024 - never reviewed  Allergen Reaction Noted   Hydrochlorothiazide Swelling 03/08/2017   Fluticasone propionate Other (See Comments) 05/23/2018    Family History  Problem Relation Age of Onset   Breast cancer Neg Hx     Social History   Socioeconomic History   Marital status: Single    Spouse name: Not on file   Number of children: Not on file   Years of education: Not on file   Highest education level: Not on file  Occupational History   Not on file  Tobacco Use   Smoking status: Not on file   Smokeless tobacco: Not on file  Substance and Sexual Activity   Alcohol use: Not on file   Drug use: Not on file   Sexual activity: Not on file  Other Topics Concern   Not on file  Social History Narrative   Not on file   Social Drivers of Health  Financial Resource Strain: Not on file  Food Insecurity: Not on file  Transportation Needs: Not on file  Physical Activity: Not on file  Stress: Not on file  Social Connections: Not on file  Intimate Partner Violence: Not on file    Review of Systems: See HPI, otherwise negative ROS  Physical Exam: BP (!) 134/96   Pulse 86   Temp (!) 96.1 F (35.6 C) (Temporal)   Resp 20   Ht 4\' 11"  (1.499 m)   Wt 57.6 kg   LMP 06/24/2017   SpO2 99%   BMI 25.65 kg/m  General:   Alert,  pleasant and cooperative in NAD Head:  Normocephalic and atraumatic. Neck:  Supple; no masses or  thyromegaly. Lungs:  Clear throughout to auscultation.    Heart:  Regular rate and rhythm. Abdomen:  Soft, nontender and nondistended. Normal bowel sounds, without guarding, and without rebound.   Neurologic:  Alert and  oriented x4;  grossly normal neurologically.  Impression/Plan: Mackenzie Duarte is here for an colonoscopy to be performed for colon cancer screening  Risks, benefits, limitations, and alternatives regarding  colonoscopy have been reviewed with the patient.  Questions have been answered.  All parties agreeable.   Lannette Donath, MD  02/19/2024, 8:41 AM

## 2024-03-09 DIAGNOSIS — E119 Type 2 diabetes mellitus without complications: Secondary | ICD-10-CM | POA: Diagnosis not present

## 2024-03-09 DIAGNOSIS — Z1389 Encounter for screening for other disorder: Secondary | ICD-10-CM | POA: Diagnosis not present

## 2024-03-09 DIAGNOSIS — Z712 Person consulting for explanation of examination or test findings: Secondary | ICD-10-CM | POA: Diagnosis not present

## 2024-03-09 DIAGNOSIS — Z0131 Encounter for examination of blood pressure with abnormal findings: Secondary | ICD-10-CM | POA: Diagnosis not present

## 2024-03-12 DIAGNOSIS — E113513 Type 2 diabetes mellitus with proliferative diabetic retinopathy with macular edema, bilateral: Secondary | ICD-10-CM | POA: Diagnosis not present

## 2024-05-07 DIAGNOSIS — E113513 Type 2 diabetes mellitus with proliferative diabetic retinopathy with macular edema, bilateral: Secondary | ICD-10-CM | POA: Diagnosis not present

## 2024-05-14 DIAGNOSIS — H26492 Other secondary cataract, left eye: Secondary | ICD-10-CM | POA: Diagnosis not present

## 2024-07-09 DIAGNOSIS — E113513 Type 2 diabetes mellitus with proliferative diabetic retinopathy with macular edema, bilateral: Secondary | ICD-10-CM | POA: Diagnosis not present

## 2024-08-13 DIAGNOSIS — E113513 Type 2 diabetes mellitus with proliferative diabetic retinopathy with macular edema, bilateral: Secondary | ICD-10-CM | POA: Diagnosis not present

## 2024-09-10 DIAGNOSIS — E113513 Type 2 diabetes mellitus with proliferative diabetic retinopathy with macular edema, bilateral: Secondary | ICD-10-CM | POA: Diagnosis not present

## 2024-09-10 DIAGNOSIS — Z794 Long term (current) use of insulin: Secondary | ICD-10-CM | POA: Diagnosis not present

## 2024-09-18 DIAGNOSIS — E781 Pure hyperglyceridemia: Secondary | ICD-10-CM | POA: Diagnosis not present

## 2024-09-18 DIAGNOSIS — Z0131 Encounter for examination of blood pressure with abnormal findings: Secondary | ICD-10-CM | POA: Diagnosis not present

## 2024-09-18 DIAGNOSIS — Z712 Person consulting for explanation of examination or test findings: Secondary | ICD-10-CM | POA: Diagnosis not present

## 2024-09-18 DIAGNOSIS — E119 Type 2 diabetes mellitus without complications: Secondary | ICD-10-CM | POA: Diagnosis not present

## 2024-09-18 DIAGNOSIS — Z1389 Encounter for screening for other disorder: Secondary | ICD-10-CM | POA: Diagnosis not present

## 2024-09-18 DIAGNOSIS — F411 Generalized anxiety disorder: Secondary | ICD-10-CM | POA: Diagnosis not present

## 2024-10-19 DIAGNOSIS — E113513 Type 2 diabetes mellitus with proliferative diabetic retinopathy with macular edema, bilateral: Secondary | ICD-10-CM | POA: Diagnosis not present

## 2024-11-23 DIAGNOSIS — E113513 Type 2 diabetes mellitus with proliferative diabetic retinopathy with macular edema, bilateral: Secondary | ICD-10-CM | POA: Diagnosis not present
